# Patient Record
Sex: Male | Born: 1970 | Hispanic: Yes | Marital: Single | Smoking: Never smoker
Health system: Southern US, Community
[De-identification: ages and names within clinical notes are randomized; demographics above are authoritative.]

---

## 2012-07-15 ENCOUNTER — Encounter: Payer: Self-pay | Admitting: Physician Assistant

## 2012-07-15 ENCOUNTER — Ambulatory Visit: Payer: Self-pay | Admitting: Family Medicine

## 2012-07-15 ENCOUNTER — Ambulatory Visit: Payer: Self-pay

## 2012-07-15 VITALS — BP 110/85 | HR 80 | Temp 98.7°F | Resp 18 | Ht 69.0 in | Wt 219.0 lb

## 2012-07-15 DIAGNOSIS — M545 Low back pain, unspecified: Secondary | ICD-10-CM

## 2012-07-15 MED ORDER — MELOXICAM 15 MG PO TABS: 15.0000 mg | ORAL_TABLET | Freq: Every day | ORAL | Status: DC

## 2012-07-15 MED ORDER — CYCLOBENZAPRINE HCL 10 MG PO TABS: 10.0000 mg | ORAL_TABLET | Freq: Every evening | ORAL | Status: DC | PRN

## 2012-07-15 NOTE — Patient Instructions (Signed)
Ejercicios para la espalda (Back Exercises) Estos ejercicios ayudan a tratar y prevenir lesiones en la espalda. El objetivo es aumentar la fuerza de los msculos abdominales y dorsales y la flexibilidad de la espalda. Debe comenzar con estos ejercicios cuando ya no tenga dolor. Los ejercicios para la espalda incluyen:  Inclinacin de la pelvis - Recustese sobre la espalda con las rodillas flexionadas. Incline la pelvis hasta que la parte inferior de la espalda se apoye en el piso. Mantenga esta posicin durante 5 a 10 segundos y repita entre 5 y 10 veces.  Rodilla al pecho  Empuje primero una rodilla contra el pecho y [place] 20 a 30 segundos; repita con la otra rodilla y luego con ambas a la vez. Esto puede realizarlo con la otra pierna extendida o flexionada, del modo en que se sienta ms cmodo.  Abdominales o despegar el cccix del suelo empleando la musculatura abdominal  Flexione las rodillas 90 grados. Comience inclinando la pelvis y realice un ejercicio abdominal lento y parcial, elevando el tronco slo entre 30 y 45 grados del suelo. Emplee al [employer] 2 y 3 segundos para cada abdominal. No realice los abdominales con las rodillas extendidas. Si le resulta difcil realizar abdominales parciales, simplemente haga lo que se explic anteriormente, pero slo contraiga los msculos abdominales y Buyer, retail tal como se le ha indicado.  Inclinacin de la cadera - Recustese sobre la espalda con las rodillas flexionadas a 90 grados. Empjese con los pies y los hombros mientras eleva la cadera un par de centmetros del suelo, [place] durante 10 segundos y repita entre 5 y 10 veces.  Arcos dorsales  Acustese sobre el estmago e impulse el tronco hacia atrs sobre los codos flexionados. Presione lentamente con las manos, formando un arco con la zona inferior de la espalda. Repita entre 3 y 5 veces. Al realizar las repeticiones, luego de un tiempo disminuirn la rigidez y las  [place].  Elevacin de los hombros  Acustese hacia abajo con los brazos a los lados del cuerpo. Presione las caderas y Dance movement psychotherapist torso contra el suelo mientras eleva lentamente la cabeza y los hombros del suelo. No exagere con los ejercicios, especialmente en el comienzo. Los ejercicios pueden causar alguna molestia leve en la espalda durante algunos minutos; sin embargo, si el dolor es muy intenso, o dura ms de 15 minutos, no siga con la actividad fsica hasta que consulte al profesional que lo asiste. Los problemas en la espalda mejoran de [place] lenta con esta terapia.  Consulte al profesional para que lo ayude a planificar un programa de ejercicios adecuado para su espalda. Document Released: 02/04/2005 Document Revised: 04/29/2011 [facility] Patient Information 2014 [place], [place]. Back Exercises Back exercises help treat and prevent back injuries. The goal of back exercises is to increase the strength of your abdominal and back muscles and the flexibility of your back. These exercises should be started when you no longer have back pain. Back exercises include:  Pelvic Tilt. Lie on your back with your knees bent. Tilt your pelvis until the lower part of your back is against the floor. Hold this position 5 to 10 sec and repeat 5 to 10 times.  Knee to Chest. Pull first 1 knee up against your chest and hold for 20 to 30 seconds, repeat this with the other knee, and then both knees. This may be done with the other leg straight or bent, whichever feels better.  Sit-Ups or Curl-Ups. Bend your knees 90 degrees. Start with tilting  your pelvis, and do a partial, slow sit-up, lifting your trunk only 30 to 45 degrees off the floor. Take at least 2 to 3 seconds for each sit-up. Do not do sit-ups with your knees out straight. If partial sit-ups are difficult, simply do the above but with only tightening your abdominal muscles and holding it as directed.  Hip-Lift. Lie on your back with your knees flexed 90  degrees. Push down with your feet and shoulders as you raise your hips a couple inches off the floor; hold for 10 seconds, repeat 5 to 10 times.  Back arches. Lie on your stomach, propping yourself up on bent elbows. Slowly press on your hands, causing an arch in your low back. Repeat 3 to 5 times. Any initial stiffness and discomfort should lessen with repetition over time.  Shoulder-Lifts. Lie face down with arms beside your body. Keep hips and torso pressed to floor as you slowly lift your head and shoulders off the floor. Do not overdo your exercises, especially in the beginning. Exercises may cause you some mild back discomfort which lasts for a few minutes; however, if the pain is more severe, or lasts for more than 15 minutes, do not continue exercises until you see your caregiver. Improvement with exercise therapy for back problems is slow.  See your caregivers for assistance with developing a proper back exercise program. Document Released: 03/14/2004 Document Revised: 04/29/2011 Document Reviewed: 12/06/2010 [facility] Patient Information 2014 [place], [place].

## 2012-07-15 NOTE — Progress Notes (Signed)
  Subjective:    Patient ID: Ray Cole, male    DOB: Feb 16, 1971, 42 y.o.   MRN: [id number]  HPI This 42 y.o. male presents for evaluation of back pain. Pain in the LEFT low back.  A small area. "A nerve.  A ball I can touch."  Saw a chiropractor (about a year ago) that seemed to help, but the symptoms recurred about a week ago after climbing a ladder.  Feels like it's affecting the entire left side of his body, from the heel to the shoulder, though he doesn't have pain anywhere other than his low back.  "I get stressed out easily.  No energy."   Has had pain in the heel, under the foot. Sometimes has cramps in both legs with crossing his legs. Has taken no medications to alleviate his discomfort.  Past medical history, surgical history, family history, social history and problem list reviewed.  Review of Systems As above. No loss of bowel or bladder control.  No saddle anesthesia.  No weakness, paresthesias.    Objective:   Physical Exam Blood pressure 110/85, pulse 80, temperature 98.7 F (37.1 C), temperature source Oral, resp. rate 18, height 5\' 9"  (1.753 m), weight 219 lb (99.338 kg). Body mass index is 32.33 kg/(m^2). Well-developed, well nourished [place] man who is awake, alert and oriented, in NAD. HEENT: [place]/AT, PERRL, EOMI.  Sclera and conjunctiva are clear.   Neck: supple, non-tender, no lymphadenopathy, thyromegaly. Heart: RRR, no murmur Lungs: normal effort, CTA Back: no bony tenderness.  Mild tenderness just above the LEFT SI joint.  FROM without pain, but has "discomfort" with rotation to the RIGHT and side-bending to the RIGHT. Extremities: no cyanosis, clubbing or edema. Skin: warm and dry without rash. Neurologic: Normal sensation, strength and DTRs are symmetric bilaterally. Psychologic: good mood and appropriate affect, normal speech and behavior.  LS Spine: [facility] reading (PRIMARY) by  Dr. Patsy Lager.  Normal lumbar and sacral spine.      Assessment & Plan:   Back pain, lumbosacral - Plan: DG Lumbar Spine Complete, meloxicam (MOBIC) 15 MG tablet, cyclobenzaprine (FLEXERIL) 10 MG tablet  Back exercises provided. Encouraged good body mechanics at work and home.  RTC if symptoms worsen/persist.  Fernande Bras, PA-C Physician Assistant-Certified [facility]

## 2012-08-19 NOTE — Progress Notes (Signed)
This encounter was created in error - please disregard.

## 2012-12-26 ENCOUNTER — Ambulatory Visit: Payer: Self-pay | Admitting: Physician Assistant

## 2012-12-26 VITALS — BP 126/74 | HR 79 | Temp 97.9°F | Resp 18 | Ht 69.0 in | Wt 219.0 lb

## 2012-12-26 DIAGNOSIS — R05 Cough: Secondary | ICD-10-CM

## 2012-12-26 DIAGNOSIS — R059 Cough, unspecified: Secondary | ICD-10-CM

## 2012-12-26 DIAGNOSIS — J209 Acute bronchitis, unspecified: Secondary | ICD-10-CM

## 2012-12-26 MED ORDER — AZITHROMYCIN 250 MG PO TABS: ORAL_TABLET | ORAL | Status: DC

## 2012-12-26 MED ORDER — BENZONATATE 100 MG PO CAPS: 100.0000 mg | ORAL_CAPSULE | Freq: Three times a day (TID) | ORAL | Status: DC | PRN

## 2012-12-26 NOTE — Progress Notes (Signed)
  Subjective:    Patient ID: Ray Cole, male    DOB: 28-Jun-1970, 42 y.o.   MRN: [id number]  Cough Pertinent negatives include no chest pain, chills, ear pain, fever, headaches, sore throat, shortness of breath or wheezing.   42 year old male presents for evaluation of cough x 4 weeks.  States at onset of symptoms he had a URI with sore throat, nasal congestion, and cough.  States all symptoms have improved except his cough. Admits it is productive at times. Denies fever, chills, SOB, wheezing, chest pain, hemoptysis, dizziness, sinus pain, headache, nausea, or vomiting.  He has been taking Robitussin which does not seem to help.  No hx of asthma or other lung disease but does have a hx of mild seasonal allergies. Not on any medications for this.   Patient is otherwise healthy with no other concerns today Works as an Personnel officer.     Review of Systems  Constitutional: Negative for fever and chills.  HENT: Negative for congestion, ear pain, sinus pressure and sore throat.   Respiratory: Positive for cough. Negative for shortness of breath and wheezing.   Cardiovascular: Negative for chest pain.  Gastrointestinal: Negative for nausea, vomiting and abdominal pain.  Neurological: Negative for dizziness and headaches.       Objective:   Physical Exam  Constitutional: He is oriented to person, place, and time. He appears well-developed and well-nourished.  HENT:  Head: Normocephalic and atraumatic.  Right Ear: Hearing, tympanic membrane, external ear and ear canal normal.  Left Ear: Hearing, tympanic membrane, external ear and ear canal normal.  Mouth/Throat: Uvula is midline, oropharynx is clear and moist and mucous membranes are normal.  Eyes: Conjunctivae are normal.  Neck: Normal range of motion. Neck supple.  Cardiovascular: Normal rate, regular rhythm and normal heart sounds.   Pulmonary/Chest: Effort normal and breath sounds normal.  Neurological: He is alert and oriented to  person, place, and time.  Psychiatric: He has a normal mood and affect. His behavior is normal. Judgment and thought content normal.          Assessment & Plan:  Acute bronchitis - Plan: azithromycin (ZITHROMAX) 250 MG tablet  Cough - Plan: benzonatate (TESSALON) 100 MG capsule  Will go ahead and treat with Zpack.  Tessalon perles tid prn cough RTC if fever develops or if he begins to have more productive cough or chest pain Increase fluids and rest. Ok to go to work.

## 2018-05-08 ENCOUNTER — Encounter: Payer: Self-pay | Admitting: Emergency Medicine

## 2018-05-08 ENCOUNTER — Other Ambulatory Visit: Payer: Self-pay

## 2018-05-08 ENCOUNTER — Ambulatory Visit: Payer: Self-pay | Admitting: Emergency Medicine

## 2018-05-08 VITALS — BP 104/78 | HR 90 | Temp 97.8°F | Resp 16 | Ht 68.25 in | Wt 241.8 lb

## 2018-05-08 DIAGNOSIS — R0981 Nasal congestion: Secondary | ICD-10-CM

## 2018-05-08 DIAGNOSIS — J0100 Acute maxillary sinusitis, unspecified: Secondary | ICD-10-CM

## 2018-05-08 MED ORDER — TRIAMCINOLONE ACETONIDE 55 MCG/ACT NA AERO: 2.0000 | INHALATION_SPRAY | Freq: Every day | NASAL | 12 refills | Status: DC

## 2018-05-08 MED ORDER — AMOXICILLIN-POT CLAVULANATE 875-125 MG PO TABS: 1.0000 | ORAL_TABLET | Freq: Two times a day (BID) | ORAL | 0 refills | Status: AC

## 2018-05-08 MED ORDER — PSEUDOEPHEDRINE-GUAIFENESIN ER 60-600 MG PO TB12: 1.0000 | ORAL_TABLET | Freq: Two times a day (BID) | ORAL | 1 refills | Status: AC

## 2018-05-08 NOTE — Progress Notes (Signed)
Ray Cole 48 y.o.   Chief Complaint  Patient presents with  . Nasal Congestion    x 1 week or more with runny nose    HISTORY OF PRESENT ILLNESS: This is a 48 y.o. male complaining of one-week history of runny nose and congestion with discharge.  No other significant symptoms.  No recent traveling or coronavirus exposure.  No chronic medical problems.  HPI   Prior to Admission medications   Medication Sig Start Date End Date Taking? Authorizing Provider  Cetirizine HCl (ZYRTEC ALLERGY PO) Take by mouth daily.   Yes [provider]  benzonatate (TESSALON) 100 MG capsule Take 1-2 capsules (100-200 mg total) by mouth 3 (three) times daily as needed for cough. Patient not taking: Reported on 05/08/2018 12/26/12   Nelva Nay, PA-C  cyclobenzaprine (FLEXERIL) 10 MG tablet Take 1 tablet (10 mg total) by mouth at bedtime as needed for muscle spasms. Patient not taking: Reported on 05/08/2018 07/15/12   Porfirio Oar, PA  meloxicam (MOBIC) 15 MG tablet Take 1 tablet (15 mg total) by mouth daily. Patient not taking: Reported on 05/08/2018 07/15/12   Porfirio Oar, PA    No Known Allergies  There are no active problems to display for this patient.   No past medical history on file.  No past surgical history on file.  Social History   Socioeconomic History  . Marital status: Single    Spouse name: n/a  . Number of children: 0  . Years of education: 10th grade  . Highest education level: Not on file  Occupational History  . Occupation: Engineer, site  . Financial resource strain: Not on file  . Food insecurity:    Worry: Not on file    Inability: Not on file  . Transportation needs:    Medical: Not on file    Non-medical: Not on file  Tobacco Use  . Smoking status: Never Smoker  . Smokeless tobacco: Never Used  Substance and Sexual Activity  . Alcohol use: Yes  . Drug use: No  . Sexual activity: Never  Lifestyle  . Physical activity:   Days per week: Not on file    Minutes per session: Not on file  . Stress: Not on file  Relationships  . Social connections:    Talks on phone: Not on file    Gets together: Not on file    Attends religious service: Not on file    Active member of club or organization: Not on file    Attends meetings of clubs or organizations: Not on file    Relationship status: Not on file  . Intimate partner violence:    Fear of current or ex partner: Not on file    Emotionally abused: Not on file    Physically abused: Not on file    Forced sexual activity: Not on file  Other Topics Concern  . Not on file  Social History Narrative   From [place], [place].  Came to the [place] in 1992.    Family History  Problem Relation Age of Onset  . Diabetes Mother      Review of Systems  Constitutional: Negative.  Negative for chills and fever.  HENT: Positive for congestion and sinus pain. Negative for sore throat.   Eyes: Negative.   Respiratory: Negative.  Negative for cough, sputum production and shortness of breath.   Cardiovascular: Negative.  Negative for chest pain and palpitations.  Gastrointestinal: Negative for abdominal pain, diarrhea, nausea and  vomiting.  Genitourinary: Negative.   Skin: Negative.  Negative for rash.  Neurological: Negative for dizziness and headaches.  Endo/Heme/Allergies: Negative.   All other systems reviewed and are negative.  Vitals:   05/08/18 1550  BP: 104/78  Pulse: 90  Resp: 16  Temp: 97.8 F (36.6 C)  SpO2: 97%     Physical Exam Vitals signs reviewed.  Constitutional:      Appearance: Normal appearance.  HENT:     Head: Normocephalic and atraumatic.     Nose: Congestion present.     Right Turbinates: Swollen.     Left Turbinates: Swollen.     Right Sinus: Maxillary sinus tenderness present.     Left Sinus: Maxillary sinus tenderness present.  Eyes:     Extraocular Movements: Extraocular movements intact.     Conjunctiva/sclera: Conjunctivae  normal.     Pupils: Pupils are equal, round, and reactive to light.  Neck:     Musculoskeletal: Normal range of motion.  Cardiovascular:     Rate and Rhythm: Normal rate and regular rhythm.     Heart sounds: Normal heart sounds.  Pulmonary:     Effort: Pulmonary effort is normal.     Breath sounds: Normal breath sounds.  Musculoskeletal: Normal range of motion.  Skin:    General: Skin is warm and dry.     Capillary Refill: Capillary refill takes less than 2 seconds.  Neurological:     General: No focal deficit present.     Mental Status: He is alert and oriented to person, place, and time.  Psychiatric:        Mood and Affect: Mood normal.        Behavior: Behavior normal.      ASSESSMENT & PLAN: Ray Cole was seen today for nasal congestion.  Diagnoses and all orders for this visit:  Sinus congestion -     triamcinolone (NASACORT) 55 MCG/ACT AERO nasal inhaler; Place 2 sprays into the nose daily. -     pseudoephedrine-guaifenesin (MUCINEX D) 60-600 MG 12 hr tablet; Take 1 tablet by mouth every 12 (twelve) hours for 5 days.  Acute non-recurrent maxillary sinusitis -     amoxicillin-clavulanate (AUGMENTIN) 875-125 MG tablet; Take 1 tablet by mouth 2 (two) times daily for 7 days.    Patient Instructions       If you have lab work done today you will be contacted with your lab results within the next 2 weeks.  If you have not heard from [place] then please contact us. The fastest way to get your results is to register for My Chart.   IF you received an x-ray today, you will receive an invoice from [facility]. Please contact [facility] at [phone] with questions or concerns regarding your invoice.   IF you received labwork today, you will receive an invoice from [place]. Please contact [facility] at [phone] with questions or concerns regarding your invoice.   Our billing staff will not be able to assist you with questions regarding bills from these  companies.  You will be contacted with the lab results as soon as they are available. The fastest way to get your results is to activate your My Chart account. Instructions are located on the last page of this paperwork. If you have not heard from [place] regarding the results in 2 weeks, please contact this office.     Sinusitis, en adultos Sinusitis, Adult La sinusitis es el dolor y la hinchazn (inflamacin) de los senos paranasales. Los senos  paranasales son espacios vacos en los huesos alrededor del rostro. Estos se encuentran en los siguientes lugares:  Alrededor de los ojos.  En la mitad de la frente.  Detrs de Architectural technologist.  En los pmulos. Los senos paranasales y las fosas nasales estn cubiertos de un lquido llamado mucosidad. La mucosidad drena a travs de los senos paranasales. La hinchazn puede atrapar mucosidad en los senos paranasales. Esto permite que se desarrollen grmenes (bacterias, virus u hongos), lo que produce infecciones. La [employer], la causa de esta afeccin es un virus. Cules son las causas? Las causas de esta afeccin son:  [place].  Asma.  Grmenes.  Objetos que obstruyen la nariz o los senos paranasales.  Crecimientos en el interior de la nariz (plipos nasales).  Sustancias qumicas o irritantes que estn presentes en el aire.  Hongos (poco frecuente). Qu incrementa el riesgo? Es ms probable que contraiga esta afeccin si:  Tiene debilitado el sistema de defensa del organismo (sistema inmunitario).  Nada o bucea mucho.  [place] aerosoles nasales en exceso.  Fuma. Cules son los signos o los sntomas? Los principales sntomas de esta afeccin son dolor y sensacin de presin alrededor de los senos paranasales. Otros sntomas pueden incluir los siguientes:  Nariz tapada (congestin nasal).  Goteo nasal (drenaje).  Hinchazn y calor en los senos paranasales.  Dolor de [place].  Dolor dental.  Tos que puede empeorar por la  noche.  Mucosidad que se acumula en la garganta o la parte posterior de la nariz (goteo posnasal).  Incapacidad de sentir olores y sabores.  Estar muy cansado (fatiga).  Grant Ruts.  Dolor de Advertising copywriter.  Mal aliento. Cmo se diagnostica? Esta afeccin se diagnostica en funcin de lo siguiente:  Sus sntomas.  Sus antecedentes mdicos.  Un examen fsico.  Pruebas para averiguar si la afeccin es de corta duracin [place]) o de larga duracin (crnica). El mdico puede: ? Revisarle la nariz para detectar crecimientos (plipos). ? Revisarle los senos paranasales con una herramienta que tiene una luz (endoscopio). ? Revisar si tiene alergias o grmenes. ? Hacerle pruebas de diagnstico por imgenes, como una resonancia magntica (RM) o exploracin por tomografa computarizada (TC). Cmo se trata? El tratamiento de esta afeccin depende de la causa y si es de corta o de larga duracin.  Si la causa es un virus, los sntomas deberan desaparecer solos en el trmino de 10das. Pueden darle medicamentos para aliviar los sntomas. Entre ellos, se incluyen los siguientes: ? Medicamentos para Actuary tejido inflamado de la Clinical cytogeneticist. ? Medicamentos para tratar alergias (antihistamnicos). ? Un aerosol para tratar la hinchazn de las fosas nasales. ? Enjuagues que ayudan a eliminar la mucosidad espesa de la nariz (lavados con solucin salina nasal).  Si la causa es una bacteria, es posible que el mdico espere para averiguar si usted mejora sin TEFL teacher. Es posible que le den un antibitico si usted tiene: ? Una infeccin grave. ? El sistema de defensa del organismo debilitado.  Si la causa son crecimientos en la Darene Lamer, es posible que necesite [place]. Siga estas indicaciones en su casa: [employer], use o aplique los medicamentos de venta libre y los recetados solamente como se lo haya indicado el mdico. Estos pueden incluir aerosoles nasales.  Si le recetaron un  antibitico, tmelo como se lo haya indicado el mdico. No deje de tomar los antibiticos aunque comience a Actor. Hidrtese y humidifique los ambientes   Beba suficiente agua para Radio producer pis (la  orina) de color amarillo plido.  Use un humidificador de vapor fro para mantener la humedad de su hogar por encima del 50%.  Inhale vapor durante 10a , de 3a 4veces al da, o como se lo haya indicado el mdico. Puede hacer esto en el bao con el vapor del agua caliente de la ducha.  Trate de no exponerse al aire fro o seco. Reposo  Descanse todo lo posible.  Duerma con la cabeza levantada (elevada).  Asegrese de dormir lo suficiente cada noche. Indicaciones generales   Pngase un pao caliente y hmedo en el rostro 3 a 4 veces al da, o con la frecuencia indicada por el mdico. Esto ayuda a Multimedia programmer.  Lvese las manos frecuentemente con agua y [place]. Use un desinfectante para manos si no dispone de [place] y [place].  No fume. Evite estar cerca de personas que fuman (fumador pasivo).  Concurra a todas las visitas de [address] se lo haya indicado el mdico. Esto es importante. Comunquese con un mdico si:  Tiene fiebre.  Sus sntomas empeoran.  Los sntomas no mejoran en el perodo de 10das. Solicite ayuda inmediatamente si:  Tiene un dolor de cabeza muy intenso.  No puede dejar de vomitar.  Tiene dolor muy intenso o hinchazn en la zona del rostro o los ojos.  Tiene dificultad para ver.  Se siente confundido.  Tiene el cuello rgido.  Tiene dificultad para respirar. Resumen  La sinusitis es la hinchazn de los senos paranasales. Los senos paranasales son espacios vacos en los huesos alrededor del rostro.  La causa de esta afeccin es la inflamacin o hinchazn de los tejidos que estn en el interior de la [place]. Esto hace que los grmenes queden atrapados. Los grmenes pueden provocar infecciones.  Si le recetaron un  antibitico, tmelo como se lo haya indicado el mdico. No deje de tomarlo aunque comience a sentirse mejor.  Concurra a todas las visitas de [address] se lo haya indicado el mdico. Esto es importante. Esta informacin no tiene Theme park manager el consejo del mdico. Asegrese de hacerle al mdico cualquier pregunta que tenga. Document Released: 10/30/2011 Document Revised: 08/20/2017 Document Reviewed: 08/20/2017 Elsevier Interactive Patient Education  2019 Elsevier Inc.      Edwina Barth, MD [facility]

## 2018-05-08 NOTE — Patient Instructions (Addendum)
   If you have lab work done today you will be contacted with your lab results within the next 2 weeks.  If you have not heard from us then please contact us. The fastest way to get your results is to register for My Chart.   IF you received an x-ray today, you will receive an invoice from [facility]. Please contact [facility] at [phone] with questions or concerns regarding your invoice.   IF you received labwork today, you will receive an invoice from [facility]. Please contact [facility] at [phone] with questions or concerns regarding your invoice.   Our billing staff will not be able to assist you with questions regarding bills from these companies.  You will be contacted with the lab results as soon as they are available. The fastest way to get your results is to activate your My Chart account. Instructions are located on the last page of this paperwork. If you have not heard from us regarding the results in 2 weeks, please contact this office.     Sinusitis, en adultos Sinusitis, Adult La sinusitis es el dolor y la hinchazn (inflamacin) de los senos paranasales. Los senos paranasales son espacios vacos en los huesos alrededor del rostro. Estos se encuentran en los siguientes lugares:  Alrededor de los ojos.  En la mitad de la frente.  Detrs de la nariz.  En los pmulos. Los senos paranasales y las fosas nasales estn cubiertos de un lquido llamado mucosidad. La mucosidad drena a travs de los senos paranasales. La hinchazn puede atrapar mucosidad en los senos paranasales. Esto permite que se desarrollen grmenes (bacterias, virus u hongos), lo que produce infecciones. La mayora de las veces, la causa de esta afeccin es un virus. Cules son las causas? Las causas de esta afeccin son:  Alergias.  Asma.  Grmenes.  Objetos que obstruyen la nariz o los senos paranasales.  Crecimientos en el interior de la nariz (plipos  nasales).  Sustancias qumicas o irritantes que estn presentes en el aire.  Hongos (poco frecuente). Qu incrementa el riesgo? Es ms probable que contraiga esta afeccin si:  Tiene debilitado el sistema de defensa del organismo (sistema inmunitario).  Nada o bucea mucho.  Usa aerosoles nasales en exceso.  Fuma. Cules son los signos o los sntomas? Los principales sntomas de esta afeccin son dolor y sensacin de presin alrededor de los senos paranasales. Otros sntomas pueden incluir los siguientes:  Nariz tapada (congestin nasal).  Goteo nasal (drenaje).  Hinchazn y calor en los senos paranasales.  Dolor de cabeza.  Dolor dental.  Tos que puede empeorar por la noche.  Mucosidad que se acumula en la garganta o la parte posterior de la nariz (goteo posnasal).  Incapacidad de sentir olores y sabores.  Estar muy cansado (fatiga).  Fiebre.  Dolor de garganta.  Mal aliento. Cmo se diagnostica? Esta afeccin se diagnostica en funcin de lo siguiente:  Sus sntomas.  Sus antecedentes mdicos.  Un examen fsico.  Pruebas para averiguar si la afeccin es de corta duracin (aguda) o de larga duracin (crnica). El mdico puede: ? Revisarle la nariz para detectar crecimientos (plipos). ? Revisarle los senos paranasales con una herramienta que tiene una luz (endoscopio). ? Revisar si tiene alergias o grmenes. ? Hacerle pruebas de diagnstico por imgenes, como una resonancia magntica (RM) o exploracin por tomografa computarizada (TC). Cmo se trata? El tratamiento de esta afeccin depende de la causa y si es de corta o de larga duracin.  Si la   causa es un virus, los sntomas deberan desaparecer solos en el trmino de 10das. Pueden darle medicamentos para aliviar los sntomas. Entre ellos, se incluyen los siguientes: ? Medicamentos para Actuary tejido inflamado de la Clinical cytogeneticist. ? Medicamentos para tratar alergias (antihistamnicos). ? Un aerosol  para tratar la hinchazn de las fosas nasales. ? Enjuagues que ayudan a eliminar la mucosidad espesa de la nariz (lavados con solucin salina nasal).  Si la causa es una bacteria, es posible que el mdico espere para averiguar si usted mejora sin TEFL teacher. Es posible que le den un antibitico si usted tiene: ? Una infeccin grave. ? El sistema de defensa del organismo debilitado.  Si la causa son crecimientos en la Darene Lamer, es posible que necesite [place]. Siga estas indicaciones en su casa: [employer], use o aplique los medicamentos de venta libre y los recetados solamente como se lo haya indicado el mdico. Estos pueden incluir aerosoles nasales.  Si le recetaron un antibitico, tmelo como se lo haya indicado el mdico. No deje de tomar los antibiticos aunque comience a Actor. Hidrtese y humidifique los ambientes   Beba suficiente agua para Pharmacologist el pis (la orina) de color amarillo plido.  Use un humidificador de vapor fro para mantener la humedad de su hogar por encima del 50%.  Inhale vapor durante 10a , de 3a 4veces al da, o como se lo haya indicado el mdico. Puede hacer esto en el bao con el vapor del agua caliente de la ducha.  Trate de no exponerse al aire fro o seco. Reposo  Descanse todo lo posible.  Duerma con la cabeza levantada (elevada).  Asegrese de dormir lo suficiente cada noche. Indicaciones generales   Pngase un pao caliente y hmedo en el rostro 3 a 4 veces al da, o con la frecuencia indicada por el mdico. Esto ayuda a Multimedia programmer.  Lvese las manos frecuentemente con agua y [place]. Use un desinfectante para manos si no dispone de [place] y [place].  No fume. Evite estar cerca de personas que fuman (fumador pasivo).  Concurra a todas las visitas de [address] se lo haya indicado el mdico. Esto es importante. Comunquese con un mdico si:  Tiene fiebre.  Sus sntomas empeoran.  Los  sntomas no mejoran en el perodo de 10das. Solicite ayuda inmediatamente si:  Tiene un dolor de cabeza muy intenso.  No puede dejar de vomitar.  Tiene dolor muy intenso o hinchazn en la zona del rostro o los ojos.  Tiene dificultad para ver.  Se siente confundido.  Tiene el cuello rgido.  Tiene dificultad para respirar. Resumen  La sinusitis es la hinchazn de los senos paranasales. Los senos paranasales son espacios vacos en los huesos alrededor del rostro.  La causa de esta afeccin es la inflamacin o hinchazn de los tejidos que estn en el interior de la [place]. Esto hace que los grmenes queden atrapados. Los grmenes pueden provocar infecciones.  Si le recetaron un antibitico, tmelo como se lo haya indicado el mdico. No deje de tomarlo aunque comience a sentirse mejor.  Concurra a todas las visitas de [address] se lo haya indicado el mdico. Esto es importante. Esta informacin no tiene Theme park manager el consejo del mdico. Asegrese de hacerle al mdico cualquier pregunta que tenga. Document Released: 10/30/2011 Document Revised: 08/20/2017 Document Reviewed: 08/20/2017 Elsevier Interactive Patient Education  2019 [employer].

## 2018-11-26 ENCOUNTER — Other Ambulatory Visit: Payer: Self-pay

## 2018-11-26 DIAGNOSIS — Z20822 Contact with and (suspected) exposure to covid-19: Secondary | ICD-10-CM

## 2018-11-28 LAB — NOVEL CORONAVIRUS, NAA: SARS-CoV-2, NAA: NOT DETECTED

## 2019-10-08 ENCOUNTER — Ambulatory Visit (HOSPITAL_COMMUNITY)
Admission: EM | Admit: 2019-10-08 | Discharge: 2019-10-08 | Disposition: A | Discharge status: Home / Self Care | Payer: Self-pay | Attending: Family Medicine | Admitting: Family Medicine

## 2019-10-08 ENCOUNTER — Encounter (HOSPITAL_COMMUNITY): Payer: Self-pay

## 2019-10-08 ENCOUNTER — Other Ambulatory Visit: Payer: Self-pay

## 2019-10-08 ENCOUNTER — Ambulatory Visit (INDEPENDENT_AMBULATORY_CARE_PROVIDER_SITE_OTHER): Payer: Self-pay

## 2019-10-08 DIAGNOSIS — R109 Unspecified abdominal pain: Secondary | ICD-10-CM

## 2019-10-08 DIAGNOSIS — R5081 Fever presenting with conditions classified elsewhere: Secondary | ICD-10-CM | POA: Insufficient documentation

## 2019-10-08 LAB — LIPASE, BLOOD: Lipase: 38 U/L (ref 11–51)

## 2019-10-08 LAB — CBC WITH DIFFERENTIAL/PLATELET
Abs Immature Granulocytes: 0.01 10*3/uL (ref 0.00–0.07)
Basophils Absolute: 0 10*3/uL (ref 0.0–0.1)
Basophils Relative: 0 %
Eosinophils Absolute: 0 10*3/uL (ref 0.0–0.5)
Eosinophils Relative: 0 %
HCT: 48.1 % (ref 39.0–52.0)
Hemoglobin: 16.8 g/dL (ref 13.0–17.0)
Immature Granulocytes: 0 %
Lymphocytes Relative: 21 %
Lymphs Abs: 1 10*3/uL (ref 0.7–4.0)
MCH: 31.5 pg (ref 26.0–34.0)
MCHC: 34.9 g/dL (ref 30.0–36.0)
MCV: 90.1 fL (ref 80.0–100.0)
Monocytes Absolute: 0.5 10*3/uL (ref 0.1–1.0)
Monocytes Relative: 11 %
Neutro Abs: 3.2 10*3/uL (ref 1.7–7.7)
Neutrophils Relative %: 68 %
Platelets: 130 10*3/uL — ABNORMAL LOW (ref 150–400)
RBC: 5.34 MIL/uL (ref 4.22–5.81)
RDW: 11.5 % (ref 11.5–15.5)
WBC: 4.7 10*3/uL (ref 4.0–10.5)
nRBC: 0 % (ref 0.0–0.2)

## 2019-10-08 LAB — COMPREHENSIVE METABOLIC PANEL
ALT: 36 U/L (ref 0–44)
AST: 23 U/L (ref 15–41)
Albumin: 4 g/dL (ref 3.5–5.0)
Alkaline Phosphatase: 65 U/L (ref 38–126)
Anion gap: 8 (ref 5–15)
BUN: 9 mg/dL (ref 6–20)
CO2: 26 mmol/L (ref 22–32)
Calcium: 9 mg/dL (ref 8.9–10.3)
Chloride: 102 mmol/L (ref 98–111)
Creatinine, Ser: 1.11 mg/dL (ref 0.61–1.24)
GFR calc Af Amer: >60 mL/min (ref >60)
GFR calc non Af Amer: >60 mL/min (ref >60)
Glucose, Bld: 126 mg/dL — ABNORMAL HIGH (ref 70–99)
Potassium: 3.9 mmol/L (ref 3.5–5.1)
Sodium: 136 mmol/L (ref 135–145)
Total Bilirubin: 0.6 mg/dL (ref 0.3–1.2)
Total Protein: 7.1 g/dL (ref 6.5–8.1)

## 2019-10-08 MED ORDER — ACETAMINOPHEN 325 MG PO TABS
650.0000 mg | ORAL_TABLET | Freq: Once | ORAL | Status: AC
  Administered 2019-10-08: 650 mg via ORAL

## 2019-10-08 MED ORDER — ACETAMINOPHEN 325 MG PO TABS
ORAL_TABLET | ORAL | Status: AC
  Filled 2019-10-08: qty 2

## 2019-10-08 NOTE — ED Triage Notes (Signed)
Pt c/o L sided abdominal pain and diarrhea x 3 days with fever today, patient had negative covid rapid test at [facility] prior to arrival

## 2019-10-08 NOTE — ED Provider Notes (Signed)
[facility]    CSN: [id number] Arrival date & time: 10/08/19  1625      History   Chief Complaint Chief Complaint  Patient presents with  . Abdominal Pain  . Diarrhea    HPI Ray Cole is a 49 y.o. male.   The history is provided by the patient. No language interpreter was used.  Abdominal Pain Pain location:  LUQ Pain quality: aching   Pain radiates to:  Does not radiate Timing:  Constant Chronicity:  New Context: not sick contacts   Relieved by:  Nothing Worsened by:  Nothing Ineffective treatments:  None tried Associated symptoms: diarrhea   Diarrhea Associated symptoms: abdominal pain    Pt has a fever and he feels like his abdomen is swollen History reviewed. No pertinent past medical history.  Patient Active Problem List   Diagnosis Date Noted  . Acute non-recurrent maxillary sinusitis 05/08/2018    History reviewed. No pertinent surgical history.     Home Medications    Prior to Admission medications   Not on File    Family History Family History  Problem Relation Age of Onset  . Diabetes Mother     Social History Social History   Tobacco Use  . Smoking status: Never Smoker  . Smokeless tobacco: Never Used  Substance Use Topics  . Alcohol use: Yes  . Drug use: No     Allergies   Patient has no known allergies.   Review of Systems Review of Systems  Gastrointestinal: Positive for abdominal pain and diarrhea.  All other systems reviewed and are negative.    Physical Exam Triage Vital Signs ED Triage Vitals  Enc Vitals Group     BP 10/08/19 1800 122/69     Pulse Rate 10/08/19 1800 (!) 103     Resp 10/08/19 1800 (!) 22     Temp 10/08/19 1800 (!) 101.3 F (38.5 C)     Temp src --      SpO2 10/08/19 1800 98 %     Weight --      Height --      Head Circumference --      Peak Flow --      Pain Score 10/08/19 2011 0     Pain Loc --      Pain Edu? --      Excl. in GC? --    No data found.  Updated  Vital Signs BP 122/69   Pulse (!) 103   Temp (!) 101.3 F (38.5 C)   Resp (!) 22   SpO2 98%   Visual Acuity Right Eye Distance:   Left Eye Distance:   Bilateral Distance:    Right Eye Near:   Left Eye Near:    Bilateral Near:     Physical Exam Vitals and nursing note reviewed.  Constitutional:      Appearance: He is well-developed.  HENT:     Head: Normocephalic and atraumatic.  Eyes:     Conjunctiva/sclera: Conjunctivae normal.  Cardiovascular:     Rate and Rhythm: Normal rate and regular rhythm.     Heart sounds: No murmur heard.   Pulmonary:     Effort: Pulmonary effort is normal. No respiratory distress.     Breath sounds: Normal breath sounds.  Abdominal:     General: Bowel sounds are normal.     Palpations: Abdomen is soft.     Tenderness: There is abdominal tenderness in the left upper quadrant.  Musculoskeletal:  Cervical back: Neck supple.  Skin:    General: Skin is warm and dry.  Neurological:     General: No focal deficit present.     Mental Status: He is alert.  Psychiatric:        Mood and Affect: Mood normal.      UC Treatments / Results  Labs (all labs ordered are listed, but only abnormal results are displayed) Labs Reviewed  CBC WITH DIFFERENTIAL/PLATELET - Abnormal; Notable for the following components:      Result Value   Platelets 130 (*)    All other components within normal limits  COMPREHENSIVE METABOLIC PANEL - Abnormal; Notable for the following components:   Glucose, Bld 126 (*)    All other components within normal limits  LIPASE, BLOOD    EKG   Radiology DG Abdomen 1 View  Result Date: 10/08/2019 CLINICAL DATA:  49 year old male with abdominal pain. EXAM: ABDOMEN - 1 VIEW COMPARISON:  None. FINDINGS: There is no bowel dilatation or evidence of obstruction. No free air or radiopaque calculi. The osseous structures and soft tissues are unremarkable. IMPRESSION: Negative. Electronically Signed   By: Elgie Collard M.D.    On: 10/08/2019 19:07    Procedures Procedures (including critical care time)  Medications Ordered in UC Medications  acetaminophen (TYLENOL) tablet 650 mg (650 mg Oral Given 10/08/19 1805)    Initial Impression / Assessment and Plan / UC Course  I have reviewed the triage vital signs and the nursing notes.  Pertinent labs & imaging results that were available during my care of the patient were reviewed by me and considered in my medical decision making (see chart for details).     MDM:  covid pending.  Pt advised to follow up with Gi for evalaution Final Clinical Impressions(s) / UC Diagnoses   Final diagnoses:  Abdominal pain, unspecified abdominal location  Fever in other diseases     Discharge Instructions     Go to the Emergency department if symptoms worsen or change    ED Prescriptions    None     PDMP not reviewed this encounter.  An After Visit Summary was printed and given to the patient.    Elson Areas, [place] 10/08/19 2147

## 2019-10-08 NOTE — Discharge Instructions (Addendum)
Go to the Emergency department if symptoms worsen or change  

## 2020-03-31 ENCOUNTER — Other Ambulatory Visit: Payer: Self-pay

## 2020-03-31 DIAGNOSIS — Z20822 Contact with and (suspected) exposure to covid-19: Secondary | ICD-10-CM

## 2020-04-01 LAB — NOVEL CORONAVIRUS, NAA: SARS-CoV-2, NAA: NOT DETECTED

## 2020-04-01 LAB — SARS-COV-2, NAA 2 DAY TAT

## 2021-03-03 IMAGING — DX DG ABDOMEN 1V
1 series · 1 of 1 positions shown · non-contrast
Comparison: None.

CLINICAL DATA: 48-year-old male with abdominal pain.

EXAM:
ABDOMEN - 1 VIEW

[abdomen kub]
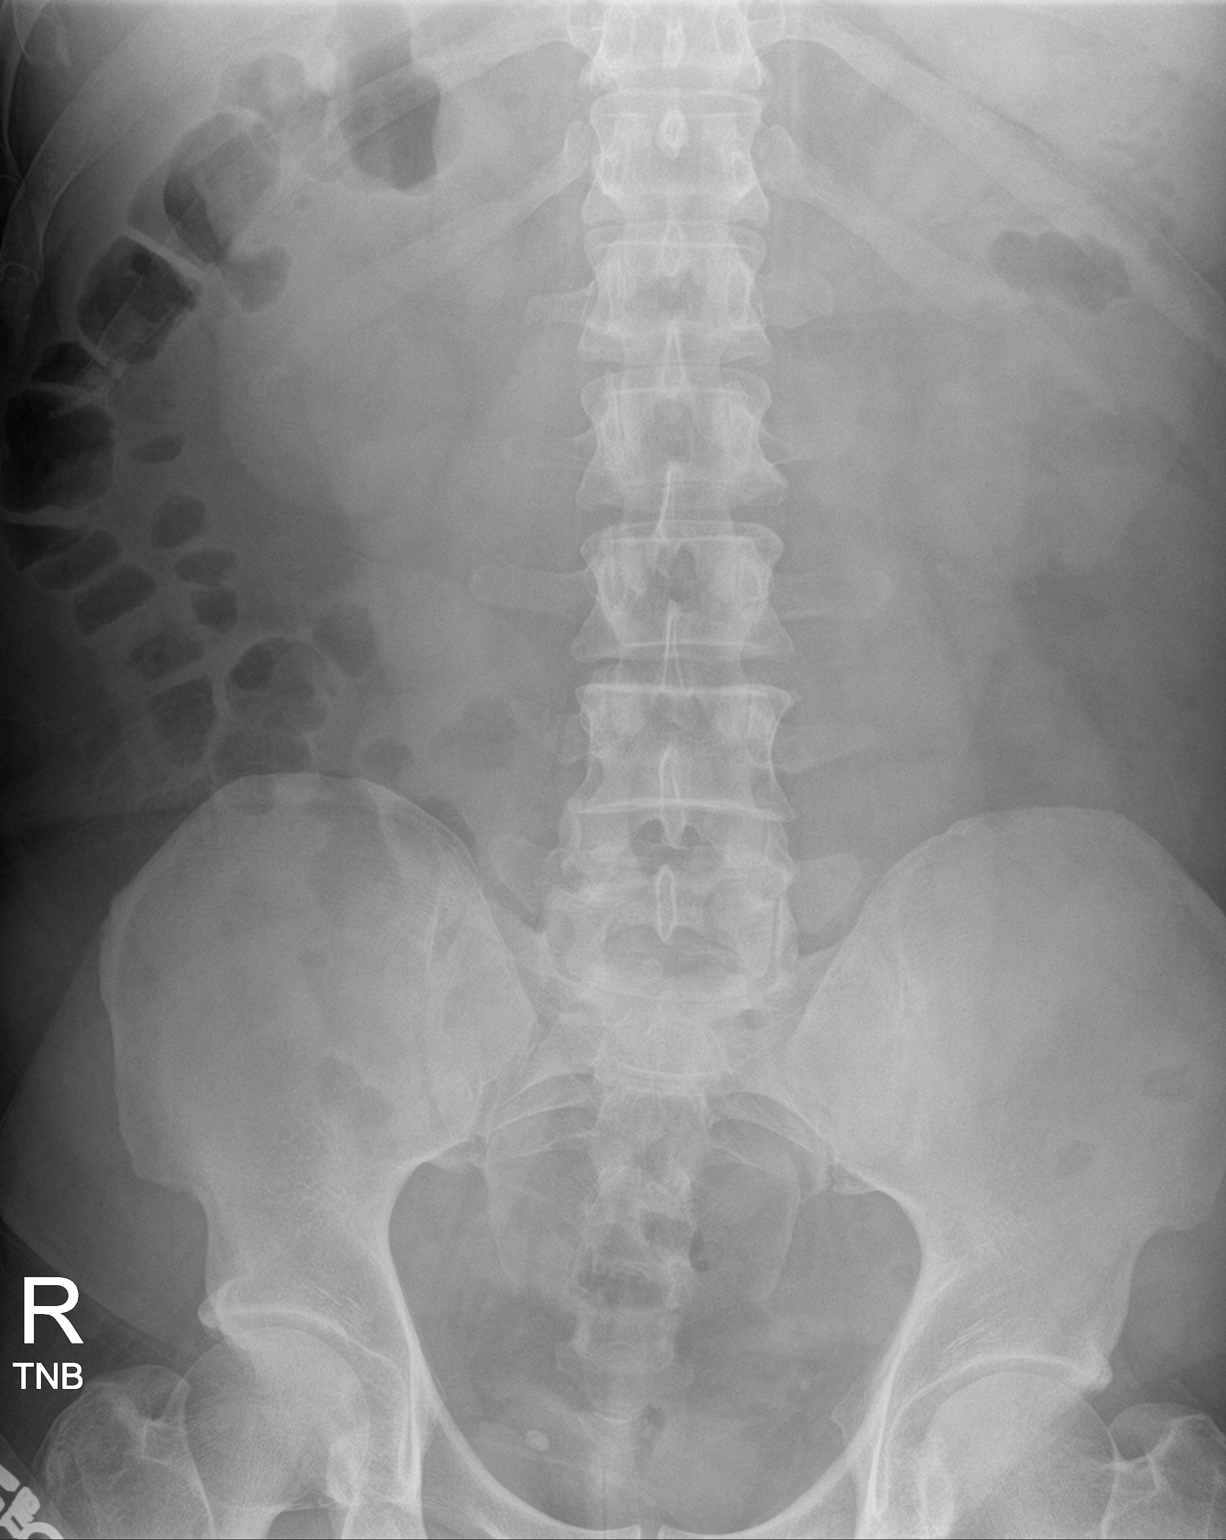

[1 of 1 positions shown; findings below may reference images not displayed]

FINDINGS: There is no bowel dilatation or evidence of obstruction. No free air
or radiopaque calculi. The osseous structures and soft tissues are
unremarkable.
IMPRESSION: Negative.
